# Patient Record
Sex: Male | Born: 1985 | Race: White | Hispanic: No | Marital: Married | Smoking: Former smoker
Health system: Southern US, Community
[De-identification: ages and names within clinical notes are randomized; demographics above are authoritative.]

## PROBLEM LIST (undated history)

## (undated) DIAGNOSIS — S0990XA Unspecified injury of head, initial encounter: Secondary | ICD-10-CM

## (undated) DIAGNOSIS — K219 Gastro-esophageal reflux disease without esophagitis: Secondary | ICD-10-CM

## (undated) DIAGNOSIS — S42009A Fracture of unspecified part of unspecified clavicle, initial encounter for closed fracture: Secondary | ICD-10-CM

## (undated) DIAGNOSIS — R519 Headache, unspecified: Secondary | ICD-10-CM

## (undated) DIAGNOSIS — R51 Headache: Secondary | ICD-10-CM

## (undated) HISTORY — PX: OTHER SURGICAL HISTORY: SHX169

---
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
## 2014-12-04 ENCOUNTER — Encounter (HOSPITAL_COMMUNITY): Payer: Self-pay | Admitting: *Deleted

## 2014-12-04 ENCOUNTER — Other Ambulatory Visit (HOSPITAL_COMMUNITY): Payer: Self-pay | Admitting: Orthopedic Surgery

## 2014-12-04 NOTE — Progress Notes (Signed)
Pt denies SOB, chest pain, and being under the care of a cardiologist. Pt denies having an EKG and chest x ray within the last year. Pt denies having a stress test, echo and cardiac cath. Pt made aware to stop  taking Aspirin, otc vitamins and herbal medications. Do not take any NSAIDs ie: Ibuprofen, Advil, Naproxen or any medication containing Aspirin. Pt verbalized understanding of all pre-op instructions.

## 2014-12-05 ENCOUNTER — Ambulatory Visit (HOSPITAL_COMMUNITY): Payer: 59

## 2014-12-05 ENCOUNTER — Ambulatory Visit (HOSPITAL_COMMUNITY)
Admission: RE | Admit: 2014-12-05 | Discharge: 2014-12-05 | Disposition: A | Discharge status: Home / Self Care | Payer: 59 | Source: Ambulatory Visit | Attending: Orthopedic Surgery | Admitting: Orthopedic Surgery

## 2014-12-05 ENCOUNTER — Encounter (HOSPITAL_COMMUNITY): Payer: Self-pay | Admitting: *Deleted

## 2014-12-05 ENCOUNTER — Encounter (HOSPITAL_COMMUNITY)
Admission: RE | Disposition: A | Discharge status: Home / Self Care | Payer: Self-pay | Source: Ambulatory Visit | Attending: Orthopedic Surgery

## 2014-12-05 ENCOUNTER — Ambulatory Visit (HOSPITAL_COMMUNITY): Payer: 59 | Admitting: Anesthesiology

## 2014-12-05 DIAGNOSIS — K219 Gastro-esophageal reflux disease without esophagitis: Secondary | ICD-10-CM | POA: Diagnosis not present

## 2014-12-05 DIAGNOSIS — Z419 Encounter for procedure for purposes other than remedying health state, unspecified: Secondary | ICD-10-CM

## 2014-12-05 DIAGNOSIS — Y9355 Activity, bike riding: Secondary | ICD-10-CM | POA: Diagnosis not present

## 2014-12-05 DIAGNOSIS — S42002A Fracture of unspecified part of left clavicle, initial encounter for closed fracture: Secondary | ICD-10-CM | POA: Insufficient documentation

## 2014-12-05 DIAGNOSIS — Z87891 Personal history of nicotine dependence: Secondary | ICD-10-CM | POA: Diagnosis not present

## 2014-12-05 HISTORY — DX: Fracture of unspecified part of unspecified clavicle, initial encounter for closed fracture: S42.009A

## 2014-12-05 HISTORY — DX: Unspecified injury of head, initial encounter: S09.90XA

## 2014-12-05 HISTORY — DX: Headache, unspecified: R51.9

## 2014-12-05 HISTORY — PX: ORIF CLAVICULAR FRACTURE: SHX5055

## 2014-12-05 HISTORY — DX: Gastro-esophageal reflux disease without esophagitis: K21.9

## 2014-12-05 HISTORY — DX: Headache: R51

## 2014-12-05 LAB — CBC
HEMATOCRIT: 41 % (ref 39.0–52.0)
Hemoglobin: 13.6 g/dL (ref 13.0–17.0)
MCH: 29.1 pg (ref 26.0–34.0)
MCHC: 33.2 g/dL (ref 30.0–36.0)
MCV: 87.6 fL (ref 78.0–100.0)
PLATELETS: 254 10*3/uL (ref 150–400)
RBC: 4.68 MIL/uL (ref 4.22–5.81)
RDW: 12.6 % (ref 11.5–15.5)
WBC: 4.1 10*3/uL (ref 4.0–10.5)

## 2014-12-05 SURGERY — OPEN REDUCTION INTERNAL FIXATION (ORIF) CLAVICULAR FRACTURE
Anesthesia: General | Site: Shoulder | Laterality: Left

## 2014-12-05 MED ORDER — CHLORHEXIDINE GLUCONATE 4 % EX LIQD
60.0000 mL | Freq: Once | CUTANEOUS | Status: DC
  Filled 2014-12-05: qty 60

## 2014-12-05 MED ORDER — MIDAZOLAM HCL 2 MG/2ML IJ SOLN
INTRAMUSCULAR | Status: AC
Start: 2014-12-05 — End: 2014-12-05
  Filled 2014-12-05: qty 2

## 2014-12-05 MED ORDER — NEOSTIGMINE METHYLSULFATE 10 MG/10ML IV SOLN
INTRAVENOUS | Status: AC
  Filled 2014-12-05: qty 2

## 2014-12-05 MED ORDER — PHENYLEPHRINE HCL 10 MG/ML IJ SOLN
10.0000 mg | INTRAVENOUS | Status: DC | PRN
  Administered 2014-12-05: 30 ug/min via INTRAVENOUS

## 2014-12-05 MED ORDER — PROPOFOL 10 MG/ML IV BOLUS
INTRAVENOUS | Status: AC
  Filled 2014-12-05: qty 20

## 2014-12-05 MED ORDER — ARTIFICIAL TEARS OP OINT
TOPICAL_OINTMENT | OPHTHALMIC | Status: AC
  Filled 2014-12-05: qty 3.5

## 2014-12-05 MED ORDER — BUPIVACAINE HCL (PF) 0.25 % IJ SOLN
30.0000 mL | Freq: Once | INTRAMUSCULAR | Status: DC
Start: 2014-12-05 — End: 2014-12-05
  Filled 2014-12-05: qty 30

## 2014-12-05 MED ORDER — NEOSTIGMINE METHYLSULFATE 10 MG/10ML IV SOLN
INTRAVENOUS | Status: DC | PRN
  Administered 2014-12-05 (×2): 2 mg via INTRAVENOUS

## 2014-12-05 MED ORDER — CEFAZOLIN SODIUM-DEXTROSE 2-3 GM-% IV SOLR
2.0000 g | INTRAVENOUS | Status: AC
  Administered 2014-12-05: 2 g via INTRAVENOUS
  Filled 2014-12-05: qty 50

## 2014-12-05 MED ORDER — PHENYLEPHRINE 40 MCG/ML (10ML) SYRINGE FOR IV PUSH (FOR BLOOD PRESSURE SUPPORT)
PREFILLED_SYRINGE | INTRAVENOUS | Status: AC
  Filled 2014-12-05: qty 10

## 2014-12-05 MED ORDER — ONDANSETRON HCL 4 MG/2ML IJ SOLN
INTRAMUSCULAR | Status: AC
  Filled 2014-12-05: qty 2

## 2014-12-05 MED ORDER — OXYCODONE-ACETAMINOPHEN 10-325 MG PO TABS: 1.0000 | ORAL_TABLET | Freq: Four times a day (QID) | ORAL | Status: DC | PRN

## 2014-12-05 MED ORDER — MEPERIDINE HCL 25 MG/ML IJ SOLN: 6.2500 mg | INTRAMUSCULAR | Status: DC | PRN

## 2014-12-05 MED ORDER — LIDOCAINE HCL 4 % MT SOLN
OROMUCOSAL | Status: DC | PRN
  Administered 2014-12-05: 4 mL via TOPICAL

## 2014-12-05 MED ORDER — HYDROMORPHONE HCL 1 MG/ML IJ SOLN
INTRAMUSCULAR | Status: AC
  Filled 2014-12-05: qty 1

## 2014-12-05 MED ORDER — GLYCOPYRROLATE 0.2 MG/ML IJ SOLN
INTRAMUSCULAR | Status: DC | PRN
  Administered 2014-12-05: .2 mg via INTRAVENOUS
  Administered 2014-12-05 (×2): .3 mg via INTRAVENOUS

## 2014-12-05 MED ORDER — FENTANYL CITRATE (PF) 250 MCG/5ML IJ SOLN
INTRAMUSCULAR | Status: AC
  Filled 2014-12-05: qty 5

## 2014-12-05 MED ORDER — PROPOFOL 10 MG/ML IV BOLUS
INTRAVENOUS | Status: DC | PRN
  Administered 2014-12-05: 270 mg via INTRAVENOUS

## 2014-12-05 MED ORDER — KETOROLAC TROMETHAMINE 30 MG/ML IJ SOLN: 30.0000 mg | Freq: Once | INTRAMUSCULAR | Status: DC | PRN

## 2014-12-05 MED ORDER — HYDROMORPHONE HCL 1 MG/ML IJ SOLN
0.2500 mg | INTRAMUSCULAR | Status: DC | PRN
  Administered 2014-12-05: 0.25 mg via INTRAVENOUS
  Administered 2014-12-05: 0.5 mg via INTRAVENOUS

## 2014-12-05 MED ORDER — LIDOCAINE HCL (CARDIAC) 20 MG/ML IV SOLN
INTRAVENOUS | Status: DC | PRN
  Administered 2014-12-05: 60 mg via INTRAVENOUS

## 2014-12-05 MED ORDER — PHENYLEPHRINE HCL 10 MG/ML IJ SOLN
INTRAMUSCULAR | Status: DC | PRN
  Administered 2014-12-05: 80 ug via INTRAVENOUS

## 2014-12-05 MED ORDER — BUPIVACAINE HCL 0.25 % IJ SOLN
30.0000 mL | Freq: Once | INTRAMUSCULAR | Status: DC
  Filled 2014-12-05: qty 30

## 2014-12-05 MED ORDER — METHOCARBAMOL 500 MG PO TABS: 500.0000 mg | ORAL_TABLET | Freq: Four times a day (QID) | ORAL | Status: DC

## 2014-12-05 MED ORDER — ROCURONIUM BROMIDE 50 MG/5ML IV SOLN
INTRAVENOUS | Status: AC
  Filled 2014-12-05: qty 2

## 2014-12-05 MED ORDER — ONDANSETRON HCL 4 MG/2ML IJ SOLN
INTRAMUSCULAR | Status: DC | PRN
  Administered 2014-12-05 (×2): 4 mg via INTRAVENOUS

## 2014-12-05 MED ORDER — FENTANYL CITRATE (PF) 250 MCG/5ML IJ SOLN
INTRAMUSCULAR | Status: AC
Start: 2014-12-05 — End: 2014-12-05
  Filled 2014-12-05: qty 5

## 2014-12-05 MED ORDER — FENTANYL CITRATE (PF) 100 MCG/2ML IJ SOLN
INTRAMUSCULAR | Status: AC
  Filled 2014-12-05: qty 2

## 2014-12-05 MED ORDER — ONDANSETRON HCL 4 MG/2ML IJ SOLN: 4.0000 mg | Freq: Once | INTRAMUSCULAR | Status: DC

## 2014-12-05 MED ORDER — OXYCODONE HCL 5 MG PO TABS: 5.0000 mg | ORAL_TABLET | Freq: Once | ORAL | Status: DC | PRN

## 2014-12-05 MED ORDER — 0.9 % SODIUM CHLORIDE (POUR BTL) OPTIME
TOPICAL | Status: DC | PRN
  Administered 2014-12-05 (×3): 1000 mL

## 2014-12-05 MED ORDER — LACTATED RINGERS IV SOLN
INTRAVENOUS | Status: DC
  Administered 2014-12-05 (×2): via INTRAVENOUS

## 2014-12-05 MED ORDER — PROMETHAZINE HCL 25 MG/ML IJ SOLN: 6.2500 mg | INTRAMUSCULAR | Status: DC | PRN

## 2014-12-05 MED ORDER — FENTANYL CITRATE (PF) 100 MCG/2ML IJ SOLN
INTRAMUSCULAR | Status: DC | PRN
  Administered 2014-12-05 (×7): 50 ug via INTRAVENOUS

## 2014-12-05 MED ORDER — MIDAZOLAM HCL 2 MG/2ML IJ SOLN
INTRAMUSCULAR | Status: AC
  Filled 2014-12-05: qty 2

## 2014-12-05 MED ORDER — BUPIVACAINE HCL (PF) 0.25 % IJ SOLN
INTRAMUSCULAR | Status: DC | PRN
  Administered 2014-12-05: 10 mL

## 2014-12-05 MED ORDER — OXYCODONE HCL 5 MG/5ML PO SOLN: 5.0000 mg | Freq: Once | ORAL | Status: DC | PRN

## 2014-12-05 MED ORDER — ROCURONIUM BROMIDE 100 MG/10ML IV SOLN
INTRAVENOUS | Status: DC | PRN
  Administered 2014-12-05: 50 mg via INTRAVENOUS
  Administered 2014-12-05: 5 mg via INTRAVENOUS
  Administered 2014-12-05: 10 mg via INTRAVENOUS

## 2014-12-05 MED ORDER — LIDOCAINE HCL (CARDIAC) 20 MG/ML IV SOLN
INTRAVENOUS | Status: AC
  Filled 2014-12-05: qty 5

## 2014-12-05 MED ORDER — GLYCOPYRROLATE 0.2 MG/ML IJ SOLN
INTRAMUSCULAR | Status: AC
  Filled 2014-12-05: qty 3

## 2014-12-05 SURGICAL SUPPLY — 73 items
APL SKNCLS STERI-STRIP NONHPOA (GAUZE/BANDAGES/DRESSINGS)
BENZOIN TINCTURE PRP APPL 2/3 (GAUZE/BANDAGES/DRESSINGS) IMPLANT
BIT DRILL 1.8 (BIT) ×1
BIT DRILL 1.8MM (BIT) ×1 IMPLANT
BIT DRILL 2.4 (BIT) ×1
BIT DRILL 2.5 CANN ENDOSCOPIC (BIT) ×3 IMPLANT
BIT DRILL QC 2.4 MINI 80 (BIT) ×1 IMPLANT
BONE MATRIX DEMINERALIZED 1CC (Bone Implant) ×9 IMPLANT
CLOSURE STERI-STRIP 1/4X4 (GAUZE/BANDAGES/DRESSINGS) ×3 IMPLANT
CLOSURE WOUND 1/2 X4 (GAUZE/BANDAGES/DRESSINGS) ×1
COVER SURGICAL LIGHT HANDLE (MISCELLANEOUS) ×3 IMPLANT
DRAIN PENROSE 1/2X12 LTX STRL (WOUND CARE) IMPLANT
DRAPE C-ARM 42X72 X-RAY (DRAPES) ×3 IMPLANT
DRAPE IMP U-DRAPE 54X76 (DRAPES) ×3 IMPLANT
DRAPE INCISE IOBAN 66X45 STRL (DRAPES) ×9 IMPLANT
DRAPE U-SHAPE 47X51 STRL (DRAPES) ×6 IMPLANT
DRILL BIT 1.8MM (BIT) ×3
DRILL BIT 2.4MM (BIT) ×3
DRSG AQUACEL AG ADV 3.5X10 (GAUZE/BANDAGES/DRESSINGS) ×3 IMPLANT
DRSG MEPILEX BORDER 4X12 (GAUZE/BANDAGES/DRESSINGS) IMPLANT
DRSG MEPILEX BORDER 4X8 (GAUZE/BANDAGES/DRESSINGS) IMPLANT
DRSG PAD ABDOMINAL 8X10 ST (GAUZE/BANDAGES/DRESSINGS) IMPLANT
DURAPREP 26ML APPLICATOR (WOUND CARE) ×6 IMPLANT
ELECT REM PT RETURN 9FT ADLT (ELECTROSURGICAL) ×3
ELECTRODE REM PT RTRN 9FT ADLT (ELECTROSURGICAL) ×1 IMPLANT
FACESHIELD WRAPAROUND (MASK) IMPLANT
GAUZE SPONGE 4X4 12PLY STRL (GAUZE/BANDAGES/DRESSINGS) IMPLANT
GAUZE XEROFORM 5X9 LF (GAUZE/BANDAGES/DRESSINGS) IMPLANT
GLOVE BIO SURGEON ST LM GN SZ9 (GLOVE) IMPLANT
GLOVE BIO SURGEON STRL SZ7.5 (GLOVE) ×9 IMPLANT
GLOVE BIOGEL PI IND STRL 8 (GLOVE) ×1 IMPLANT
GLOVE BIOGEL PI INDICATOR 8 (GLOVE) ×2
GLOVE SURG ORTHO 8.0 STRL STRW (GLOVE) ×3 IMPLANT
GOWN STRL REUS W/ TWL LRG LVL3 (GOWN DISPOSABLE) ×2 IMPLANT
GOWN STRL REUS W/ TWL XL LVL3 (GOWN DISPOSABLE) ×1 IMPLANT
GOWN STRL REUS W/TWL LRG LVL3 (GOWN DISPOSABLE) ×6
GOWN STRL REUS W/TWL XL LVL3 (GOWN DISPOSABLE) ×3
KIT BASIN OR (CUSTOM PROCEDURE TRAY) ×3 IMPLANT
KIT ROOM TURNOVER OR (KITS) ×3 IMPLANT
MANIFOLD NEPTUNE II (INSTRUMENTS) ×3 IMPLANT
NEEDLE 21X1 OR PACK (NEEDLE) IMPLANT
NEEDLE HYPO 25GX1X1/2 BEV (NEEDLE) ×3 IMPLANT
NS IRRIG 1000ML POUR BTL (IV SOLUTION) ×9 IMPLANT
PACK SHOULDER (CUSTOM PROCEDURE TRAY) ×3 IMPLANT
PACK UNIVERSAL I (CUSTOM PROCEDURE TRAY) IMPLANT
PAD ARMBOARD 7.5X6 YLW CONV (MISCELLANEOUS) ×6 IMPLANT
PENCIL BUTTON HOLSTER BLD 10FT (ELECTRODE) IMPLANT
PLATE CLAVICLE CENTRAL 3RD (Plate) ×3 IMPLANT
SCREW CORT TI ST 2.4X14 (Screw) IMPLANT
SCREW CORT TI ST 2.4X20 (Screw) ×3 IMPLANT
SCREW CORT TI ST 2.4X22 (Screw) ×3 IMPLANT
SCREW LOW PROFILE 3.5X14 (Screw) ×3 IMPLANT
SCREW LOW PROFILE 3.5X16 (Screw) ×3 IMPLANT
SCREW NON LOCKING 3.5X10 (Screw) ×3 IMPLANT
SCREW NON-LOCKING 3.5X20 ANKLE (Screw) ×6 IMPLANT
SCREW NON-LOCKING 3.5X22MM (Screw) ×6 IMPLANT
SLING ARM IMMOBILIZER LRG (SOFTGOODS) ×3 IMPLANT
SLING ARM IMMOBILIZER MED (SOFTGOODS) IMPLANT
SPONGE LAP 4X18 X RAY DECT (DISPOSABLE) ×6 IMPLANT
STAPLER VISISTAT 35W (STAPLE) IMPLANT
STRIP CLOSURE SKIN 1/2X4 (GAUZE/BANDAGES/DRESSINGS) ×2 IMPLANT
SUCTION FRAZIER TIP 10 FR DISP (SUCTIONS) ×3 IMPLANT
SUT VIC AB 0 CT1 27 (SUTURE) ×6
SUT VIC AB 0 CT1 27XBRD ANBCTR (SUTURE) ×3 IMPLANT
SUT VIC AB 2-0 CT1 27 (SUTURE) ×9
SUT VIC AB 2-0 CT1 TAPERPNT 27 (SUTURE) ×3 IMPLANT
SUT VIC AB 2-0 CTB1 (SUTURE) ×3 IMPLANT
SYR CONTROL 10ML LL (SYRINGE) ×3 IMPLANT
TOWEL OR 17X24 6PK STRL BLUE (TOWEL DISPOSABLE) ×3 IMPLANT
TOWEL OR 17X26 10 PK STRL BLUE (TOWEL DISPOSABLE) ×3 IMPLANT
TUBE CONNECTING 12'X1/4 (SUCTIONS)
TUBE CONNECTING 12X1/4 (SUCTIONS) IMPLANT
YANKAUER SUCT BULB TIP NO VENT (SUCTIONS) IMPLANT

## 2014-12-05 NOTE — Anesthesia Preprocedure Evaluation (Addendum)
Anesthesia Evaluation  Patient identified by MRN, date of birth, ID band Patient awake    Reviewed: Allergy & Precautions, NPO status , Patient's Chart, lab work & pertinent test results  Airway Mallampati: II  TM Distance: >3 FB Neck ROM: Full    Dental no notable dental hx.    Pulmonary former smoker,  breath sounds clear to auscultation  Pulmonary exam normal       Cardiovascular negative cardio ROS Normal cardiovascular examRhythm:Regular Rate:Normal     Neuro/Psych  Headaches, negative psych ROS   GI/Hepatic Neg liver ROS, GERD-  ,  Endo/Other  negative endocrine ROS  Renal/GU negative Renal ROS     Musculoskeletal negative musculoskeletal ROS (+)   Abdominal   Peds  Hematology negative hematology ROS (+)   Anesthesia Other Findings   Reproductive/Obstetrics negative OB ROS                             Anesthesia Physical Anesthesia Plan  ASA: I  Anesthesia Plan: General   Post-op Pain Management:    Induction: Intravenous  Airway Management Planned: Oral ETT  Additional Equipment:   Intra-op Plan:   Post-operative Plan: Extubation in OR  Informed Consent: I have reviewed the patients History and Physical, chart, labs and discussed the procedure including the risks, benefits and alternatives for the proposed anesthesia with the patient or authorized representative who has indicated his/her understanding and acceptance.   Dental advisory given  Plan Discussed with: CRNA  Anesthesia Plan Comments:        Anesthesia Quick Evaluation

## 2014-12-05 NOTE — Anesthesia Procedure Notes (Signed)
Procedure Name: Intubation Date/Time: 12/05/2014 4:55 PM Performed by: Merdis Delay Pre-anesthesia Checklist: Patient identified, Timeout performed, Emergency Drugs available, Suction available and Patient being monitored Patient Re-evaluated:Patient Re-evaluated prior to inductionOxygen Delivery Method: Circle system utilized Preoxygenation: Pre-oxygenation with 100% oxygen Intubation Type: IV induction Ventilation: Mask ventilation without difficulty Laryngoscope Size: Mac and 4 Tube type: Oral Tube size: 7.5 mm Number of attempts: 1 Airway Equipment and Method: Stylet and LTA kit utilized Placement Confirmation: ETT inserted through vocal cords under direct vision,  breath sounds checked- equal and bilateral,  positive ETCO2 and CO2 detector Secured at: 22 cm Tube secured with: Tape Dental Injury: Teeth and Oropharynx as per pre-operative assessment
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
## 2014-12-05 NOTE — Anesthesia Postprocedure Evaluation (Signed)
  Anesthesia Post-op Note  Patient: Martin Myers  Procedure(s) Performed: Procedure(s): OPEN REDUCTION INTERNAL FIXATION (ORIF) CLAVICULAR FRACTURE (Left)  Patient Location: PACU  Anesthesia Type:General  Level of Consciousness: awake, alert  and oriented  Airway and Oxygen Therapy: Patient Spontanous Breathing  Post-op Pain: mild  Post-op Assessment: Post-op Vital signs reviewed, Patient's Cardiovascular Status Stable, Respiratory Function Stable, Patent Airway and Pain level controlled  Post-op Vital Signs: stable  Last Vitals:  Filed Vitals:   12/05/14 2045  BP: 129/73  Pulse: 95  Temp:   Resp: 15    Complications: No apparent anesthesia complications

## 2014-12-05 NOTE — Brief Op Note (Signed)
12/05/2014  6:47 PM  PATIENT:  Martin Myers  29 y.o. male  PRE-OPERATIVE DIAGNOSIS:  LEFT CLAVICLE FRACTURE  POST-OPERATIVE DIAGNOSIS:  LEFT CLAVICLE FRACTURE  PROCEDURE:  Procedure(s): OPEN REDUCTION INTERNAL FIXATION (ORIF) CLAVICULAR FRACTURE  SURGEON:  Surgeon(s): Cammy CopaScott Gregory Dean, MD  ASSISTANT: carla bethune  ANESTHESIA:   general  EBL: 50 ml    Total I/O In: 1000 [I.V.:1000] Out: 50 [Blood:50]  BLOOD ADMINISTERED: none  DRAINS: none   LOCAL MEDICATIONS USED:  none  SPECIMEN:  No Specimen  COUNTS:  YES  TOURNIQUET:  * No tourniquets in log *  DICTATION: .Other Dictation: Dictation Number done  PLAN OF CARE: Discharge to home after PACU  PATIENT DISPOSITION:  PACU - hemodynamically stable

## 2014-12-05 NOTE — Transfer of Care (Signed)
Immediate Anesthesia Transfer of Care Note  Patient: Martin Myers  Procedure(s) Performed: Procedure(s): OPEN REDUCTION INTERNAL FIXATION (ORIF) CLAVICULAR FRACTURE (Left)  Patient Location: PACU  Anesthesia Type:General  Level of Consciousness: awake, alert , oriented and patient cooperative  Airway & Oxygen Therapy: Patient Spontanous Breathing and Patient connected to nasal cannula oxygen  Post-op Assessment: Report given to RN and Post -op Vital signs reviewed and stable  Post vital signs: Reviewed and stable  Last Vitals:  Filed Vitals:   12/05/14 1333  BP: 124/67  Pulse: 65  Temp: 36.4 C  Resp: 16    Complications: No apparent anesthesia complications

## 2014-12-06 NOTE — Op Note (Signed)
[name]:  Myers, Martin                ACCOUNT NO.:  [id number]  MEDICAL RECORD NO.:  [id number]  LOCATION:  [facility]                         FACILITY:  [facility]  PHYSICIAN:  Burnard BuntingG. Scott Daris Harkins, M.D.    DATE OF BIRTH:  [birth year]  DATE OF PROCEDURE: DATE OF DISCHARGE:  12/05/2014                              OPERATIVE REPORT   PREOPERATIVE DIAGNOSIS:  Left 4-part clavicle fracture.  POSTOPERATIVE DIAGNOSIS:  Left 4-part clavicle fracture.  PROCEDURE:  Open reduction and internal fixation, left 4-part clavicle fracture.  SURGEON:  Burnard BuntingG. Scott Stuti Sandin, M.D.  ASSISTANT:  Patrick Jupiterarla Bethune, RNFA  INDICATIONS:  Martin Myers is a patient with displaced comminuted clavicle fracture who presents for operative management after explanation of risks and benefits.  PROCEDURE IN DETAIL:  The patient was brought to the operating room where general anesthetic was induced.  Preoperative antibiotics were administered.  Time-out was called.  The patient was placed in a beach- chair position with the head in neutral position.  Left arm and shoulder regions were prescrubbed with alcohol and Betadine, they were allowed to air dry, prepped with DuraPrep solution, draped in sterile manner.  The head was tilted slightly to the right to allow for placement of the screws.  At this time, prepping and draping was performed.  Martin Flowersoban was used to cover the operative field.  Time-out was called.  Incision was made over the clavicle essentially the entire length of the clavicle. Skin and subcutaneous tissue were sharply divided crossing nerve branches, sensory branches were preserved where possible.  The periosteum was elevated off the most lateral and most medial fragments. Middle fragment was comminuted and split longitudinally.  Initially 2 screws were placed to re-configure one of the fracture fragments to the most lateral piece, that was done with two 2.7 lag screws.  The posterior coronal fracture fragment was then lagged to the most  medial fragment using a 3.5 cortical lag screw.  At this time, the 2 fracture fragments were then reduced and an Arthrex pin hole plate was applied, with bicortical fixation achieved, 6 cortices medial and 6 cortices lateral.  Radiographic fluoroscopic interpretation demonstrated good reduction.  Thorough irrigation was performed.  Bone graft was placed because of the amount of comminution present.  This was Arthrex StimuBlast. Then, at this time, the periosteal and muscle fascia layer was closed using 0 Vicryl suture, 2-0 Vicryl suture, and the 3-0 Monocryl.  The patient tolerated the procedure well, without immediate complications. Transferred to recovery room in stable condition.     Burnard BuntingG. Scott Alyssha Housh, M.D.     GSD/MEDQ  D:  12/05/2014  T:  12/06/2014  Job:  [id number]

## 2014-12-12 ENCOUNTER — Encounter (HOSPITAL_COMMUNITY): Payer: Self-pay | Admitting: Orthopedic Surgery

## 2014-12-14 ENCOUNTER — Encounter (HOSPITAL_COMMUNITY): Payer: Self-pay | Admitting: Orthopedic Surgery

## 2014-12-15 ENCOUNTER — Encounter (HOSPITAL_COMMUNITY): Payer: Self-pay | Admitting: Orthopedic Surgery

## 2015-07-10 ENCOUNTER — Ambulatory Visit (INDEPENDENT_AMBULATORY_CARE_PROVIDER_SITE_OTHER): Payer: Managed Care, Other (non HMO) | Admitting: Family Medicine

## 2015-07-10 VITALS — BP 130/78 | HR 61 | Temp 97.8°F | Resp 18 | Ht 69.0 in | Wt 159.0 lb

## 2015-07-10 DIAGNOSIS — R21 Rash and other nonspecific skin eruption: Secondary | ICD-10-CM

## 2015-07-10 DIAGNOSIS — B356 Tinea cruris: Secondary | ICD-10-CM | POA: Diagnosis not present

## 2015-07-10 MED ORDER — PREDNISONE 20 MG PO TABS: ORAL_TABLET | ORAL | Status: AC

## 2015-07-10 MED ORDER — CLOTRIMAZOLE-BETAMETHASONE 1-0.05 % EX CREA: 1.0000 "application " | TOPICAL_CREAM | Freq: Two times a day (BID) | CUTANEOUS | Status: AC
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
## 2015-07-10 NOTE — Patient Instructions (Signed)
Let me know if the rash is not improved in 48 hours.

## 2015-07-20 ENCOUNTER — Telehealth: Payer: Self-pay

## 2015-07-20 NOTE — Telephone Encounter (Signed)
He can resume usual activities of all kinds

## 2015-07-20 NOTE — Telephone Encounter (Signed)
Pt advised.

## 2015-07-20 NOTE — Telephone Encounter (Signed)
Pt was seen on 12/13 by Dr. Milus GlazierLauenstein for Rash and nonspecific skin eruption - Primary. He was wondering when he can be intimate with his wife again, he said his rash is all cleared up. CB # [phone]

## 2016-05-27 IMAGING — RF DG C-ARM 61-120 MIN
1 series · 1 of 1 positions shown · non-contrast
Comparison: None.

CLINICAL DATA: LEFT clavicle fracture.  ORIF.

EXAM:
DG C-ARM 61-120 MIN; LEFT CLAVICLE - 2+ VIEWS

[Series 1: run · 1 of 1 slices shown]
[im 1/1]
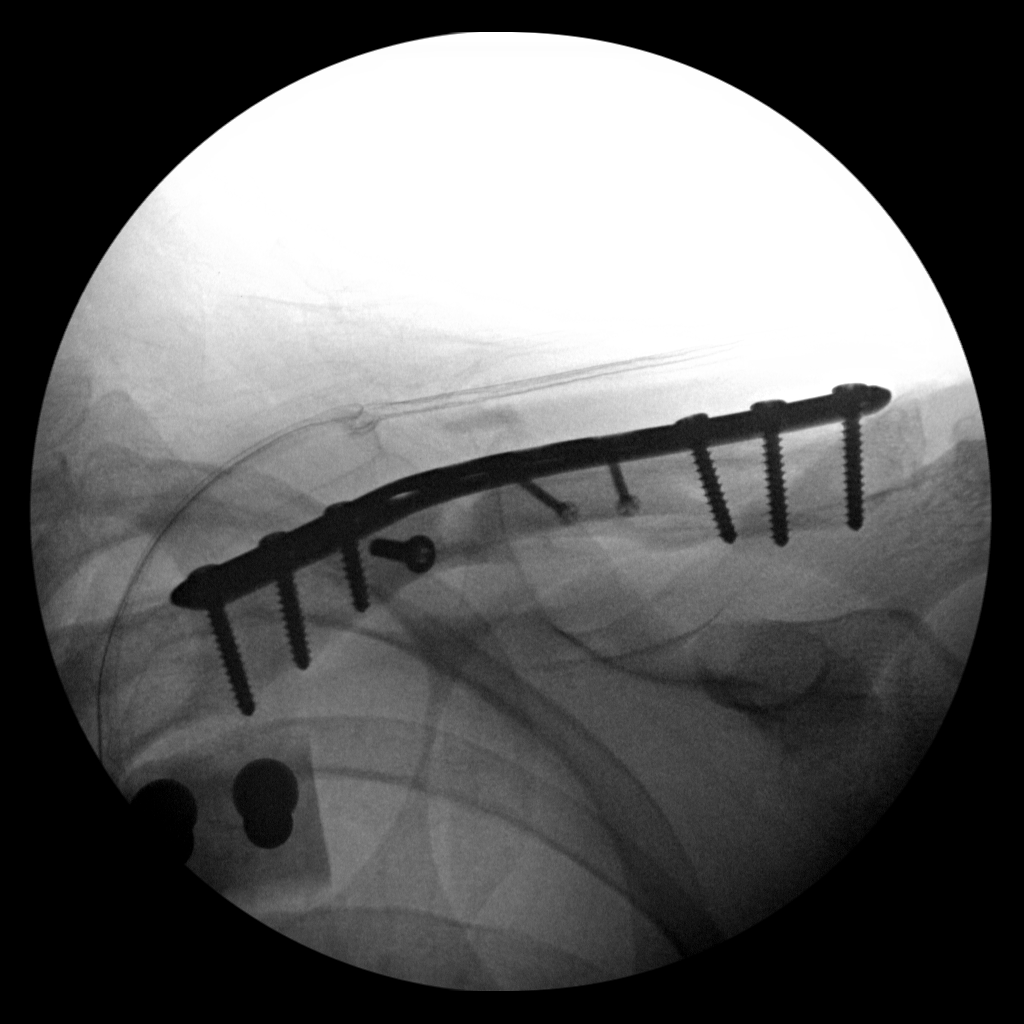

[1 of 1 positions shown; findings below may reference images not displayed]

FINDINGS: Single AP projection of the LEFT clavicle demonstrates plate and
screw fixation with interfragmentary screws.
IMPRESSION: Uncomplicated ORIF of the LEFT clavicle.

## 2019-07-26 ENCOUNTER — Ambulatory Visit: Payer: Managed Care, Other (non HMO) | Attending: Internal Medicine

## 2019-07-26 DIAGNOSIS — Z20822 Contact with and (suspected) exposure to covid-19: Secondary | ICD-10-CM

## 2019-07-27 LAB — NOVEL CORONAVIRUS, NAA: SARS-CoV-2, NAA: DETECTED — AB

## 2019-07-28 ENCOUNTER — Encounter: Payer: Self-pay | Admitting: *Deleted

## 2024-06-27 ENCOUNTER — Other Ambulatory Visit: Payer: Self-pay

## 2024-06-27 ENCOUNTER — Encounter: Payer: Self-pay | Admitting: Family Medicine

## 2024-06-27 ENCOUNTER — Ambulatory Visit (INDEPENDENT_AMBULATORY_CARE_PROVIDER_SITE_OTHER): Admitting: Family Medicine

## 2024-06-27 VITALS — BP 130/84 | Ht 69.0 in | Wt 172.0 lb

## 2024-06-27 DIAGNOSIS — R10A2 Flank pain, left side: Secondary | ICD-10-CM

## 2024-06-27 DIAGNOSIS — T148XXA Other injury of unspecified body region, initial encounter: Secondary | ICD-10-CM | POA: Diagnosis not present
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
